# Patient Record
Sex: Male | Born: 1985 | Race: Black or African American | Hispanic: No | Marital: Married | Smoking: Current every day smoker
Health system: Southern US, Community
[De-identification: ages and names within clinical notes are randomized; demographics above are authoritative.]

---

## 2011-03-13 ENCOUNTER — Emergency Department (HOSPITAL_BASED_OUTPATIENT_CLINIC_OR_DEPARTMENT_OTHER)
Admission: EM | Admit: 2011-03-13 | Discharge: 2011-03-13 | Disposition: A | Discharge status: Home / Self Care | Payer: Self-pay | Attending: Emergency Medicine | Admitting: Emergency Medicine

## 2011-03-13 ENCOUNTER — Encounter: Payer: Self-pay | Admitting: Family Medicine

## 2011-03-13 DIAGNOSIS — R51 Headache: Secondary | ICD-10-CM | POA: Insufficient documentation

## 2011-03-13 DIAGNOSIS — F17200 Nicotine dependence, unspecified, uncomplicated: Secondary | ICD-10-CM | POA: Insufficient documentation

## 2011-03-13 MED ORDER — SODIUM CHLORIDE 0.9 % IV BOLUS (SEPSIS)
1000.0000 mL | Freq: Once | INTRAVENOUS | Status: AC
  Administered 2011-03-13: 1000 mL via INTRAVENOUS

## 2011-03-13 MED ORDER — KETOROLAC TROMETHAMINE 15 MG/ML IJ SOLN
15.0000 mg | Freq: Once | INTRAMUSCULAR | Status: AC
  Administered 2011-03-13: 15 mg via INTRAVENOUS
  Filled 2011-03-13: qty 1

## 2011-03-13 MED ORDER — METOCLOPRAMIDE HCL 5 MG/ML IJ SOLN
10.0000 mg | Freq: Once | INTRAMUSCULAR | Status: AC
  Administered 2011-03-13: 10 mg via INTRAVENOUS
  Filled 2011-03-13: qty 2

## 2011-03-13 MED ORDER — DIPHENHYDRAMINE HCL 50 MG/ML IJ SOLN
25.0000 mg | Freq: Once | INTRAMUSCULAR | Status: AC
  Administered 2011-03-13: 25 mg via INTRAVENOUS
  Filled 2011-03-13: qty 1
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
## 2011-03-13 NOTE — ED Notes (Signed)
Pt c/o headache upon awakening this morning. Pt sts he did not take any medication for same. Pt denies n/v/d, fever.

## 2012-02-26 ENCOUNTER — Emergency Department (HOSPITAL_BASED_OUTPATIENT_CLINIC_OR_DEPARTMENT_OTHER): Payer: Self-pay

## 2012-02-26 ENCOUNTER — Emergency Department (HOSPITAL_BASED_OUTPATIENT_CLINIC_OR_DEPARTMENT_OTHER)
Admission: EM | Admit: 2012-02-26 | Discharge: 2012-02-26 | Disposition: A | Discharge status: Home / Self Care | Payer: Self-pay | Attending: Emergency Medicine | Admitting: Emergency Medicine

## 2012-02-26 ENCOUNTER — Encounter (HOSPITAL_BASED_OUTPATIENT_CLINIC_OR_DEPARTMENT_OTHER): Payer: Self-pay

## 2012-02-26 DIAGNOSIS — K5900 Constipation, unspecified: Secondary | ICD-10-CM | POA: Insufficient documentation

## 2012-02-26 DIAGNOSIS — F17200 Nicotine dependence, unspecified, uncomplicated: Secondary | ICD-10-CM | POA: Insufficient documentation

## 2012-02-26 DIAGNOSIS — R110 Nausea: Secondary | ICD-10-CM | POA: Insufficient documentation

## 2012-02-26 LAB — URINALYSIS, ROUTINE W REFLEX MICROSCOPIC
Glucose, UA: NEGATIVE mg/dL
Leukocytes, UA: NEGATIVE
Protein, ur: NEGATIVE mg/dL
Specific Gravity, Urine: 1.03 (ref 1.005–1.030)
pH: 5.5 (ref 5.0–8.0)

## 2012-02-26 MED ORDER — FENTANYL CITRATE 0.05 MG/ML IJ SOLN
100.0000 ug | Freq: Once | INTRAMUSCULAR | Status: AC
  Administered 2012-02-26: 100 ug via INTRAVENOUS
  Filled 2012-02-26: qty 2

## 2012-02-26 MED ORDER — SODIUM CHLORIDE 0.9 % IV SOLN
INTRAVENOUS | Status: DC
  Administered 2012-02-26: 125 mL/h via INTRAVENOUS

## 2012-02-26 MED ORDER — ONDANSETRON HCL 4 MG/2ML IJ SOLN
4.0000 mg | Freq: Once | INTRAMUSCULAR | Status: AC
Start: 2012-02-26 — End: 2012-02-26
  Administered 2012-02-26: 4 mg via INTRAVENOUS
  Filled 2012-02-26: qty 2

## 2012-02-26 NOTE — ED Provider Notes (Signed)
History     CSN: [id number]  Arrival date & time 02/26/12  7829   First MD Initiated Contact with Patient 02/26/12 [phone]      Chief Complaint  Patient presents with  . Abdominal Pain    (Consider location/radiation/quality/duration/timing/severity/associated sxs/prior treatment) HPI Is a 26 year old black male who awoke with pain in the left flank and left lower quadrant about 3 AM. He states the pain has gotten worse and is now about an 8/10. He has been nauseated with it but has not vomited. He has not had diarrhea, fever or chills. He does have a headache. He denies any urinary symptoms.  History reviewed. No pertinent past medical history.  History reviewed. No pertinent past surgical history.  No family history on file.  History  Substance Use Topics  . Smoking status: Current Every Day Smoker  . Smokeless tobacco: Not on file  . Alcohol Use: Yes      Review of Systems  All other systems reviewed and are negative.    Allergies  Review of patient's allergies indicates no known allergies.  Home Medications  No current outpatient prescriptions on file.  BP 101/53  Pulse 65  Temp 97.6 F (36.4 C) (Oral)  Resp 16  SpO2 98%  Physical Exam General: Well-developed, well-nourished male in no acute distress; appearance consistent with age of record HENT: normocephalic, atraumatic Eyes: pupils equal round and reactive to light; extraocular muscles intact Neck: supple Heart: regular rate and rhythm Lungs: clear to auscultation bilaterally Abdomen: soft; nondistended; mild left lower quadrant tenderness; bowel sounds  GU: Mild left flank tenderness Extremities: No deformity; full range of motion Neurologic: Awake, alert and oriented; motor function intact in all extremities and symmetric; no facial droop Skin: Warm and dry Psychiatric: Flat affect    ED Course  Procedures (including critical care time)     MDM   Nursing notes and vitals signs,  including pulse oximetry, reviewed.  Summary of this visit's results, reviewed by myself:  Labs:  Results for orders placed during the hospital encounter of 02/26/12  URINALYSIS, ROUTINE W REFLEX MICROSCOPIC      Component Value Range   Color, Urine YELLOW  YELLOW   APPearance CLOUDY (*) CLEAR   Specific Gravity, Urine 1.030  1.005 - 1.030   pH 5.5  5.0 - 8.0   Glucose, UA NEGATIVE  NEGATIVE mg/dL   Hgb urine dipstick NEGATIVE  NEGATIVE   Bilirubin Urine NEGATIVE  NEGATIVE   Ketones, ur NEGATIVE  NEGATIVE mg/dL   Protein, ur NEGATIVE  NEGATIVE mg/dL   Urobilinogen, UA 1.0  0.0 - 1.0 mg/dL   Nitrite NEGATIVE  NEGATIVE   Leukocytes, UA NEGATIVE  NEGATIVE    Imaging Studies: Ct Abdomen Pelvis Wo Contrast  02/26/2012  *RADIOLOGY REPORT*  Clinical Data: Flank pain and nausea.  CT ABDOMEN AND PELVIS WITHOUT CONTRAST  Technique:  Multidetector CT imaging of the abdomen and pelvis was performed following the standard protocol without intravenous contrast.  Comparison: None.  Findings: The lung bases are clear.  No pleural or pericardial effusion.  There is no hydronephrosis on the right or left and no urinary tract stones are identified.  The gallbladder, liver, spleen, adrenal glands and pancreas appear normal.  The patient has a fairly large stool burden throughout the colon.  The colon is otherwise unremarkable.  The stomach and small bowel appear normal.  No lymphadenopathy or fluid is identified. The appendix is not discretely seen but no evidence of inflammatory process is  identified.  Imaged osseous structures demonstrate convex left thoracic scoliosis.  No worrisome bony lesion is identified.  IMPRESSION: Negative for urinary tract stone.  No acute finding.  Large stool burden again noted.   Original Report Authenticated By: Bernadene Bell. D'ALESSIO, M.D.    6:23 AM Patient advised of CT findings and recommended a laxative to relieve his symptoms. He was advised that narcotics are  contraindicated in the presence of constipation.        Hanley Seamen, MD 02/26/12 [phone]

## 2012-02-26 NOTE — ED Notes (Signed)
Patient reports that he was awakened with general abdominal pain that starts on both sided and radiates to umbilicus, nausea without vomiting, no diarrhea

## 2012-04-19 ENCOUNTER — Emergency Department (HOSPITAL_BASED_OUTPATIENT_CLINIC_OR_DEPARTMENT_OTHER): Payer: Self-pay

## 2012-04-19 ENCOUNTER — Encounter (HOSPITAL_BASED_OUTPATIENT_CLINIC_OR_DEPARTMENT_OTHER): Payer: Self-pay

## 2012-04-19 ENCOUNTER — Emergency Department (HOSPITAL_BASED_OUTPATIENT_CLINIC_OR_DEPARTMENT_OTHER)
Admission: EM | Admit: 2012-04-19 | Discharge: 2012-04-19 | Disposition: A | Discharge status: Home / Self Care | Payer: Self-pay | Attending: Emergency Medicine | Admitting: Emergency Medicine

## 2012-04-19 DIAGNOSIS — B349 Viral infection, unspecified: Secondary | ICD-10-CM

## 2012-04-19 DIAGNOSIS — IMO0001 Reserved for inherently not codable concepts without codable children: Secondary | ICD-10-CM | POA: Insufficient documentation

## 2012-04-19 DIAGNOSIS — F17200 Nicotine dependence, unspecified, uncomplicated: Secondary | ICD-10-CM | POA: Insufficient documentation

## 2012-04-19 DIAGNOSIS — R05 Cough: Secondary | ICD-10-CM | POA: Insufficient documentation

## 2012-04-19 DIAGNOSIS — B9789 Other viral agents as the cause of diseases classified elsewhere: Secondary | ICD-10-CM | POA: Insufficient documentation

## 2012-04-19 DIAGNOSIS — R51 Headache: Secondary | ICD-10-CM | POA: Insufficient documentation

## 2012-04-19 DIAGNOSIS — R059 Cough, unspecified: Secondary | ICD-10-CM | POA: Insufficient documentation

## 2012-04-19 MED ORDER — IBUPROFEN 800 MG PO TABS: 800.0000 mg | ORAL_TABLET | Freq: Three times a day (TID) | ORAL | Status: AC

## 2012-04-19 MED ORDER — IBUPROFEN 800 MG PO TABS
800.0000 mg | ORAL_TABLET | Freq: Once | ORAL | Status: AC
  Administered 2012-04-19: 800 mg via ORAL
  Filled 2012-04-19: qty 1

## 2012-04-19 NOTE — ED Provider Notes (Signed)
History     CSN: [id number]  Arrival date & time 04/19/12  1601   First MD Initiated Contact with Patient 04/19/12 1623      Chief Complaint  Patient presents with  . Fever  . Generalized Body Aches  . Headache  . Cough    (Consider location/radiation/quality/duration/timing/severity/associated sxs/prior treatment) HPI Comments: Pt states that it started yesterday and he has not taken anything for the symptoms  Patient is a 26 y.o. male presenting with fever. The history is provided by the patient. No language interpreter was used.  Fever Primary symptoms of the febrile illness include fever, headaches, cough and myalgias. Primary symptoms do not include nausea, vomiting or diarrhea. The current episode started yesterday. This is a new problem. The problem has not changed since onset.   History reviewed. No pertinent past medical history.  History reviewed. No pertinent past surgical history.  No family history on file.  History  Substance Use Topics  . Smoking status: Current Every Day Smoker -- 0.5 packs/day  . Smokeless tobacco: Not on file  . Alcohol Use: Yes     Comment: occasional      Review of Systems  Constitutional: Positive for fever.  Respiratory: Positive for cough.   Gastrointestinal: Negative for nausea, vomiting and diarrhea.  Musculoskeletal: Positive for myalgias.  Neurological: Positive for headaches.    Allergies  Review of patient's allergies indicates no known allergies.  Home Medications  No current outpatient prescriptions on file.  BP 123/65  Pulse 92  Temp 100.2 F (37.9 C) (Oral)  Resp 18  Ht 6\' 1"  (1.854 m)  Wt 167 lb (75.751 kg)  BMI 22.03 kg/m2  SpO2 100%  Physical Exam  Nursing note and vitals reviewed. Constitutional: He is oriented to person, place, and time. He appears well-developed and well-nourished.  HENT:  Right Ear: External ear normal.  Left Ear: External ear normal.  Mouth/Throat: Oropharynx is clear and  moist.  Eyes: Conjunctivae normal and EOM are normal.  Neck: Normal range of motion. Neck supple.       No meningeal signs  Cardiovascular: Normal rate and regular rhythm.   Pulmonary/Chest: Effort normal and breath sounds normal.  Abdominal: Soft. Bowel sounds are normal. There is tenderness.  Musculoskeletal: Normal range of motion.  Neurological: He is alert and oriented to person, place, and time.  Skin: Skin is warm.  Psychiatric: He has a normal mood and affect.    ED Course  Procedures (including critical care time)  Labs Reviewed - No data to display Dg Chest 2 View  04/19/2012  *RADIOLOGY REPORT*  Clinical Data: Fever, body aches, headache and cough.  CHEST - 2 VIEW  Comparison: None.  Findings: Lungs are clear.  Heart size is normal.  No pneumothorax or pleural fluid.  No focal bony abnormality.  IMPRESSION: Negative chest.   Original Report Authenticated By: Holley Dexter, M.D.      1. Viral illness       MDM  Healthy appearing:no concern for meningitis:pt can treat symptomatically at home        Teressa Lower, NP 04/19/12 1730

## 2012-04-19 NOTE — ED Notes (Signed)
Pt reports onset of cough, headache, fever and generalized body aches last night.

## 2012-04-20 NOTE — ED Provider Notes (Signed)
Medical screening examination/treatment/procedure(s) were performed by non-physician practitioner and as supervising physician I was immediately available for consultation/collaboration.  Jones Skene, M.D.     Jones Skene, MD 04/20/12 0041

## 2012-05-07 ENCOUNTER — Encounter (HOSPITAL_BASED_OUTPATIENT_CLINIC_OR_DEPARTMENT_OTHER): Payer: Self-pay | Admitting: *Deleted

## 2012-05-07 ENCOUNTER — Emergency Department (HOSPITAL_BASED_OUTPATIENT_CLINIC_OR_DEPARTMENT_OTHER)
Admission: EM | Admit: 2012-05-07 | Discharge: 2012-05-07 | Disposition: A | Discharge status: Home / Self Care | Payer: No Typology Code available for payment source | Attending: Emergency Medicine | Admitting: Emergency Medicine

## 2012-05-07 ENCOUNTER — Emergency Department (HOSPITAL_BASED_OUTPATIENT_CLINIC_OR_DEPARTMENT_OTHER): Payer: No Typology Code available for payment source

## 2012-05-07 DIAGNOSIS — Y939 Activity, unspecified: Secondary | ICD-10-CM | POA: Insufficient documentation

## 2012-05-07 DIAGNOSIS — IMO0002 Reserved for concepts with insufficient information to code with codable children: Secondary | ICD-10-CM | POA: Insufficient documentation

## 2012-05-07 DIAGNOSIS — F17200 Nicotine dependence, unspecified, uncomplicated: Secondary | ICD-10-CM | POA: Insufficient documentation

## 2012-05-07 DIAGNOSIS — Y999 Unspecified external cause status: Secondary | ICD-10-CM | POA: Insufficient documentation

## 2012-05-07 DIAGNOSIS — Y92410 Unspecified street and highway as the place of occurrence of the external cause: Secondary | ICD-10-CM | POA: Insufficient documentation

## 2012-05-07 DIAGNOSIS — M545 Low back pain: Secondary | ICD-10-CM

## 2012-05-07 MED ORDER — IBUPROFEN 600 MG PO TABS: 600.0000 mg | ORAL_TABLET | Freq: Four times a day (QID) | ORAL | Status: AC | PRN
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
## 2012-12-06 ENCOUNTER — Emergency Department (HOSPITAL_BASED_OUTPATIENT_CLINIC_OR_DEPARTMENT_OTHER)
Admission: EM | Admit: 2012-12-06 | Discharge: 2012-12-06 | Disposition: A | Discharge status: Home / Self Care | Payer: No Typology Code available for payment source | Attending: Emergency Medicine | Admitting: Emergency Medicine

## 2012-12-06 ENCOUNTER — Encounter (HOSPITAL_BASED_OUTPATIENT_CLINIC_OR_DEPARTMENT_OTHER): Payer: Self-pay | Admitting: Family Medicine

## 2012-12-06 ENCOUNTER — Emergency Department (HOSPITAL_BASED_OUTPATIENT_CLINIC_OR_DEPARTMENT_OTHER): Payer: No Typology Code available for payment source

## 2012-12-06 DIAGNOSIS — F17200 Nicotine dependence, unspecified, uncomplicated: Secondary | ICD-10-CM | POA: Insufficient documentation

## 2012-12-06 DIAGNOSIS — S0990XA Unspecified injury of head, initial encounter: Secondary | ICD-10-CM | POA: Insufficient documentation

## 2012-12-06 DIAGNOSIS — M25511 Pain in right shoulder: Secondary | ICD-10-CM

## 2012-12-06 DIAGNOSIS — Y939 Activity, unspecified: Secondary | ICD-10-CM | POA: Insufficient documentation

## 2012-12-06 DIAGNOSIS — Y92410 Unspecified street and highway as the place of occurrence of the external cause: Secondary | ICD-10-CM | POA: Insufficient documentation

## 2012-12-06 DIAGNOSIS — S4980XA Other specified injuries of shoulder and upper arm, unspecified arm, initial encounter: Secondary | ICD-10-CM | POA: Insufficient documentation

## 2012-12-06 DIAGNOSIS — S46909A Unspecified injury of unspecified muscle, fascia and tendon at shoulder and upper arm level, unspecified arm, initial encounter: Secondary | ICD-10-CM | POA: Insufficient documentation

## 2012-12-06 MED ORDER — ONDANSETRON HCL 4 MG PO TABS: 4.0000 mg | ORAL_TABLET | Freq: Four times a day (QID) | ORAL | Status: AC

## 2012-12-06 MED ORDER — IBUPROFEN 800 MG PO TABS
800.0000 mg | ORAL_TABLET | Freq: Once | ORAL | Status: AC
  Administered 2012-12-06: 800 mg via ORAL
  Filled 2012-12-06: qty 1

## 2012-12-06 MED ORDER — ACETAMINOPHEN 325 MG PO TABS: 325.0000 mg | ORAL_TABLET | Freq: Four times a day (QID) | ORAL | Status: AC | PRN
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
## 2012-12-07 NOTE — ED Provider Notes (Signed)
Medical screening examination/treatment/procedure(s) were performed by non-physician practitioner and as supervising physician I was immediately available for consultation/collaboration.   Apolinar Bero B. Marchel Foote, MD 12/07/12 2047 
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
## 2015-12-25 ENCOUNTER — Encounter (HOSPITAL_BASED_OUTPATIENT_CLINIC_OR_DEPARTMENT_OTHER): Payer: Self-pay | Admitting: Emergency Medicine

## 2015-12-25 ENCOUNTER — Emergency Department (HOSPITAL_BASED_OUTPATIENT_CLINIC_OR_DEPARTMENT_OTHER): Payer: No Typology Code available for payment source

## 2015-12-25 ENCOUNTER — Emergency Department (HOSPITAL_BASED_OUTPATIENT_CLINIC_OR_DEPARTMENT_OTHER)
Admission: EM | Admit: 2015-12-25 | Discharge: 2015-12-25 | Disposition: A | Discharge status: Home / Self Care | Payer: No Typology Code available for payment source | Attending: Emergency Medicine | Admitting: Emergency Medicine

## 2015-12-25 DIAGNOSIS — R0789 Other chest pain: Secondary | ICD-10-CM

## 2015-12-25 DIAGNOSIS — F17200 Nicotine dependence, unspecified, uncomplicated: Secondary | ICD-10-CM | POA: Insufficient documentation

## 2015-12-25 MED ORDER — IBUPROFEN 800 MG PO TABS
ORAL_TABLET | ORAL | Status: AC
  Filled 2015-12-25: qty 1

## 2015-12-25 MED ORDER — IBUPROFEN 800 MG PO TABS: 800.0000 mg | ORAL_TABLET | Freq: Three times a day (TID) | ORAL | 0 refills | Status: AC | PRN

## 2015-12-25 MED ORDER — IBUPROFEN 800 MG PO TABS
800.0000 mg | ORAL_TABLET | Freq: Once | ORAL | Status: AC
  Administered 2015-12-25: 800 mg via ORAL
  Filled 2015-12-25: qty 1

## 2015-12-25 MED ORDER — IBUPROFEN 400 MG PO TABS: 600.0000 mg | ORAL_TABLET | Freq: Once | ORAL | Status: DC

## 2015-12-25 NOTE — ED Provider Notes (Signed)
[facility] Provider Note   CSN: [id number] Arrival date & time: 12/25/15  2119  By signing my name below, I, Doreatha MartinEva Mathews, attest that this documentation has been prepared under the direction and in the presence of  Eli Lilly and CompanyChristopher Aashika Carta, PA-C. Electronically Signed: Doreatha MartinEva Mathews, ED Scribe. 12/25/15. 11:13 PM.    History   Chief Complaint Chief Complaint  Patient presents with  . Chest Pain    HPI Hector ServeJohn Ward is a 30 y.o. male who presents to the Emergency Department complaining of moderate, intermittent substernal CP onset today after sneezing. Pt also complains of rhinorrhea onset. He states that his pain is exacerbated by sneezing, breathing and coughing and lasts for 3 minutes at a time. No alleviating factors noted. He denies fever, chills, SOB, abdominal pain, palpitations. He is a current smoker, 2.5 cigarettes per day. He is a non-drinker.   The history is provided by the patient. No language interpreter was used.    History reviewed. No pertinent past medical history.  There are no active problems to display for this patient.   History reviewed. No pertinent surgical history.     Home Medications    Prior to Admission medications   Medication Sig Start Date End Date Taking? Authorizing Provider  acetaminophen (TYLENOL) 325 MG tablet Take 1 tablet (325 mg total) by mouth every 6 (six) hours as needed for pain. 12/06/12   Marissa Sciacca, PA-C  ibuprofen (ADVIL,MOTRIN) 600 MG tablet Take 1 tablet (600 mg total) by mouth every 6 (six) hours as needed for pain. 05/07/12   Jerelyn ScottMartha Linker, MD  ibuprofen (ADVIL,MOTRIN) 800 MG tablet Take 1 tablet (800 mg total) by mouth 3 (three) times daily. 04/19/12   Teressa LowerVrinda Pickering, NP  ondansetron (ZOFRAN) 4 MG tablet Take 1 tablet (4 mg total) by mouth every 6 (six) hours. 12/06/12   Marissa Sciacca, PA-C    Family History History reviewed. No pertinent family history.  Social History Social History  Substance Use Topics  .  Smoking status: Current Every Day Smoker    Packs/day: 0.50  . Smokeless tobacco: Never Used  . Alcohol use Yes     Comment: occasional     Allergies   Review of patient's allergies indicates no known allergies.   Review of Systems Review of Systems A complete 10 system review of systems was obtained and all systems are negative except as noted in the HPI and PMH.    Physical Exam Updated Vital Signs BP 111/63 (BP Location: Left Arm)   Pulse 66   Temp 98.9 F (37.2 C) (Oral)   Resp 18   Ht 6\' 1"  (1.854 m)   Wt 216 lb 8 oz (98.2 kg)   SpO2 100%   BMI 28.56 kg/m   Physical Exam  Constitutional: He appears well-developed and well-nourished.  HENT:  Head: Normocephalic.  Eyes: Conjunctivae are normal.  Cardiovascular: Normal rate, regular rhythm and normal heart sounds.   No murmur heard. Pulmonary/Chest: Effort normal and breath sounds normal. No respiratory distress. He has no wheezes. He exhibits no tenderness.  Abdominal: He exhibits no distension.  Musculoskeletal: Normal range of motion.  Neurological: He is alert.  Skin: Skin is warm and dry.  Psychiatric: He has a normal mood and affect. His behavior is normal.  Nursing note and vitals reviewed.    ED Treatments / Results  Labs (all labs ordered are listed, but only abnormal results are displayed) Labs Reviewed - No data to display  EKG  EKG Interpretation  Date/Time:  Saturday December 25 2015 21:27:56 EDT Ventricular Rate:  63 PR Interval:  136 QRS Duration: 90 QT Interval:  400 QTC Calculation: 409 R Axis:   28 Text Interpretation:  Normal sinus rhythm Normal ECG No previous ECGs available Confirmed by LITTLE MD, RACHEL [id number]) on 12/25/2015 9:34:16 PM       Radiology Dg Chest 2 View  Result Date: 12/25/2015 CLINICAL DATA:  Chest pain and pressure after sneezing. EXAM: CHEST  2 VIEW COMPARISON:  04/19/2012 FINDINGS: The heart size and mediastinal contours are within normal limits. Both lungs  are clear. The visualized skeletal structures are unremarkable. IMPRESSION: No active cardiopulmonary disease. Electronically Signed   By: Ted Mcalpine M.D.   On: 12/25/2015 21:48    Procedures Procedures (including critical care time)  DIAGNOSTIC STUDIES: Oxygen Saturation is 100% on RA, normal by my interpretation.    COORDINATION OF CARE: 11:09 PM Discussed treatment plan with pt at bedside which includes CXR and pt agreed to plan.    Medications Ordered in ED Medications - No data to display   Initial Impression / Assessment and Plan / ED Course  I have reviewed the triage vital signs and the nursing notes.  Pertinent labs & imaging results that were available during my care of the patient were reviewed by me and considered in my medical decision making (see chart for details).  Clinical Course    Patient most likely has chest wall pain based on his history of present illness and physical exam findings.  Patient is advised plan and all questions were answered.  He said this and asked to follow-up with his primary care doctor  Final Clinical Impressions(s) / ED Diagnoses   Final diagnoses:  None    New Prescriptions New Prescriptions   No medications on file    I personally performed the services described in this documentation, which was scribed in my presence. The recorded information has been reviewed and is accurate.]    Charlestine Night, PA-C 12/25/15 2341    Laurence Spates, MD 12/26/15 [phone]

## 2015-12-25 NOTE — ED Triage Notes (Signed)
Patient states that he started to have central pain to his chest today worse with a sneeze. Reports that it feels like pressure on his chest

## 2015-12-25 NOTE — Discharge Instructions (Signed)
Return here as needed.  Follow up with a primary care doctor °

## 2017-07-15 IMAGING — CR DG CHEST 2V
2 series · 2 of 2 positions shown · non-contrast
Comparison: 04/19/2012

CLINICAL DATA: Chest pain and pressure after sneezing.

EXAM:
CHEST  2 VIEW

[w chest pa]
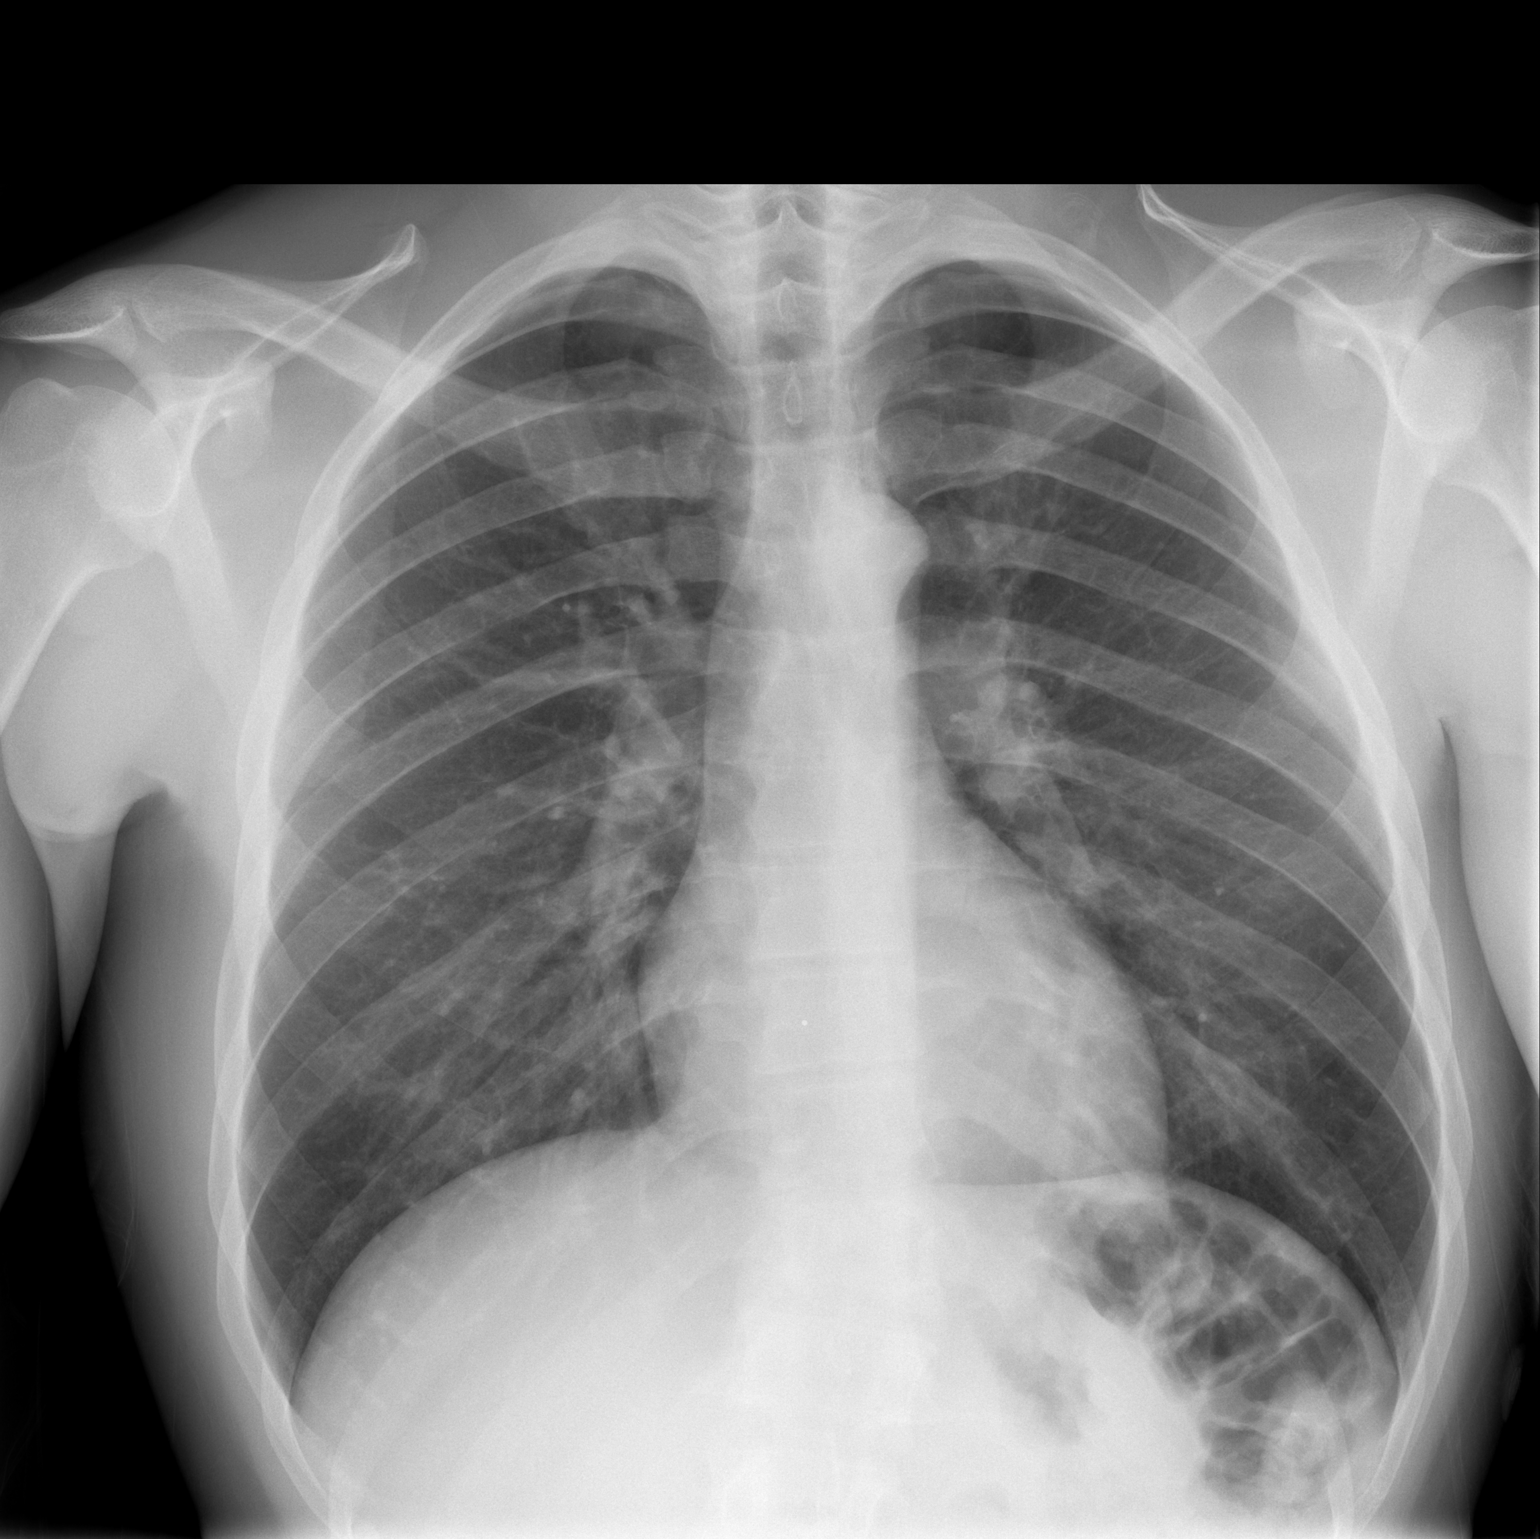

[w chest lat]
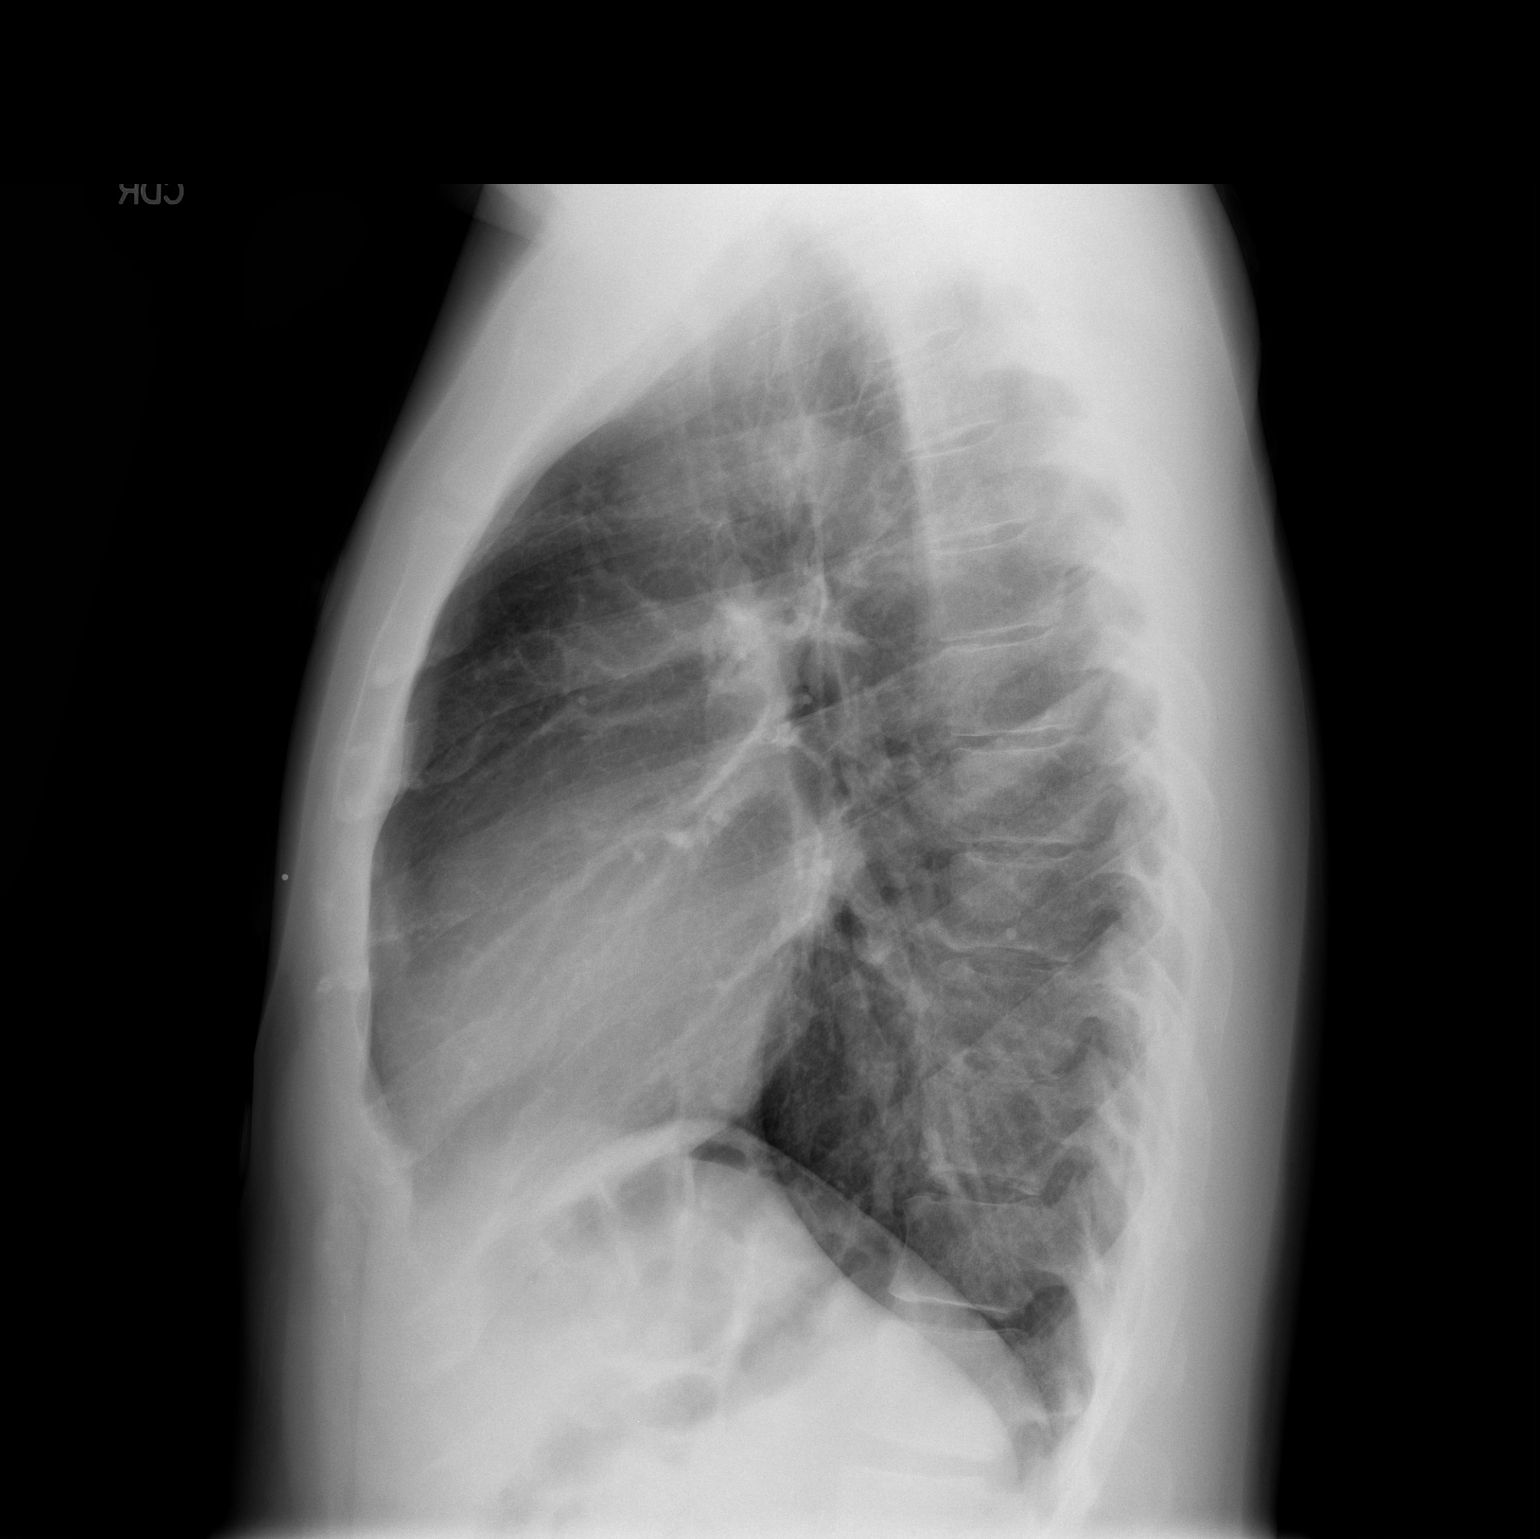

[2 of 2 positions shown; findings below may reference images not displayed]

FINDINGS: The heart size and mediastinal contours are within normal limits.
Both lungs are clear. The visualized skeletal structures are
unremarkable.
IMPRESSION: No active cardiopulmonary disease.

## 2019-10-10 ENCOUNTER — Other Ambulatory Visit: Payer: Self-pay

## 2019-10-10 ENCOUNTER — Emergency Department (HOSPITAL_BASED_OUTPATIENT_CLINIC_OR_DEPARTMENT_OTHER): Payer: 59

## 2019-10-10 ENCOUNTER — Emergency Department (HOSPITAL_BASED_OUTPATIENT_CLINIC_OR_DEPARTMENT_OTHER)
Admission: EM | Admit: 2019-10-10 | Discharge: 2019-10-10 | Disposition: A | Discharge status: Home / Self Care | Payer: 59 | Attending: Emergency Medicine | Admitting: Emergency Medicine

## 2019-10-10 ENCOUNTER — Encounter (HOSPITAL_BASED_OUTPATIENT_CLINIC_OR_DEPARTMENT_OTHER): Payer: Self-pay | Admitting: Emergency Medicine

## 2019-10-10 DIAGNOSIS — M791 Myalgia, unspecified site: Secondary | ICD-10-CM | POA: Insufficient documentation

## 2019-10-10 DIAGNOSIS — Z20822 Contact with and (suspected) exposure to covid-19: Secondary | ICD-10-CM | POA: Insufficient documentation

## 2019-10-10 DIAGNOSIS — J069 Acute upper respiratory infection, unspecified: Secondary | ICD-10-CM | POA: Diagnosis not present

## 2019-10-10 DIAGNOSIS — F17210 Nicotine dependence, cigarettes, uncomplicated: Secondary | ICD-10-CM | POA: Diagnosis not present

## 2019-10-10 DIAGNOSIS — R05 Cough: Secondary | ICD-10-CM | POA: Diagnosis not present

## 2019-10-10 DIAGNOSIS — J029 Acute pharyngitis, unspecified: Secondary | ICD-10-CM | POA: Diagnosis present

## 2019-10-10 LAB — SARS CORONAVIRUS 2 BY RT PCR (HOSPITAL ORDER, PERFORMED IN ~~LOC~~ HOSPITAL LAB): SARS Coronavirus 2: NEGATIVE

## 2019-10-10 MED ORDER — ALBUTEROL SULFATE HFA 108 (90 BASE) MCG/ACT IN AERS
8.0000 | INHALATION_SPRAY | Freq: Once | RESPIRATORY_TRACT | Status: AC
  Administered 2019-10-10: 8 via RESPIRATORY_TRACT
  Filled 2019-10-10: qty 6.7

## 2019-10-10 MED ORDER — IBUPROFEN 800 MG PO TABS
800.0000 mg | ORAL_TABLET | Freq: Once | ORAL | Status: AC
  Administered 2019-10-10: 800 mg via ORAL
  Filled 2019-10-10: qty 1

## 2019-10-10 NOTE — ED Triage Notes (Signed)
°  Patient comes in with nasal congestion and sore throat that started yesterday while at work.  Patient states he has had no sick contacts and has been vaccinated.  Felt febrile at home but not sure how high.  Taking OTC mucinex and nyquil.  Productive cough and hurts when he swallows.  Pain 6/10.

## 2019-10-10 NOTE — Discharge Instructions (Signed)
You may alternate Tylenol 1000 mg every 6 hours as needed for pain, fever and Ibuprofen 800 mg every 8 hours as needed for pain, fever.  Please take Ibuprofen with food.  Do not take more than 4000 mg of Tylenol (acetaminophen) in a 24 hour period.   Please quarantine until your COVID-19 test results are back.  You may follow-up on these with my chart.   You may continue to use your albuterol inhaler 2 to 4 puffs every 2-4 hours as needed for shortness of breath, wheezing.   Steps to find a Primary Care Provider (PCP):  Call [phone] or [phone] to access "[place] Find a Doctor Service."  2.  You may also go on the [facility] website at [email or web address]  3.  [facility] also frequently accepts new patients.  [facility]  [address] [place] [address] [phone]  4.  There are also multiple [facility], [facility] and [facility] practices throughout the [place] that are frequently accepting new patients. You may find a clinic that is close to your home and contact them.  [facility] [email or web address] [phone]  [facility] [email or web address]  [email or web address] [phone]  [address] [facility] [email or web address] [phone]  5.  Local Health Departments also can provide primary care services.  [facility]  [address] [place] [address] [phone]  [facility] Department [address] [place] [address] [phone]  [facility] [address] [place] [address]  [place] [address] [phone]

## 2019-10-10 NOTE — ED Provider Notes (Signed)
Care transferred to me.  Chest x-ray personally reviewed and is negative.  Agree with radiology interpretation.  Covid test is pending but he does not want a wait on the results.  He is otherwise well-appearing.  Return precautions and will discharge home.   Pricilla Loveless, MD 10/10/19 [phone]

## 2019-10-10 NOTE — ED Provider Notes (Signed)
TIME SEEN: 6:47 AM  CHIEF COMPLAINT: Sore throat, cough, feeling poorly  HPI: Patient is a 34 year old male with no significant past medical history who presents to the emergency department feeling poorly for the past day.  Reports sore throat, nonproductive cough, body aches.  No fevers, chills, nausea, vomiting or diarrhea.  Has had both COVID-19 vaccinations, last in February.  States he does work from [employer] but no known sick contacts.  No ear pain.  No shortness of breath.  ROS: See HPI Constitutional: no fever  Eyes: no drainage  ENT: no runny nose   Cardiovascular:  no chest pain  Resp: no SOB  GI: no vomiting GU: no dysuria Integumentary: no rash  Allergy: no hives  Musculoskeletal: no leg swelling  Neurological: no slurred speech ROS otherwise negative  PAST MEDICAL HISTORY/PAST SURGICAL HISTORY:  History reviewed. No pertinent past medical history.  MEDICATIONS:  Prior to Admission medications   Medication Sig Start Date End Date Taking? Authorizing Provider  acetaminophen (TYLENOL) 325 MG tablet Take 1 tablet (325 mg total) by mouth every 6 (six) hours as needed for pain. 12/06/12   Sciacca, Marissa, PA-C  ibuprofen (ADVIL,MOTRIN) 600 MG tablet Take 1 tablet (600 mg total) by mouth every 6 (six) hours as needed for pain. 05/07/12   Mabe, Forbes Cellar, MD  ibuprofen (ADVIL,MOTRIN) 800 MG tablet Take 1 tablet (800 mg total) by mouth 3 (three) times daily. 04/19/12   Glendell Docker, NP  ibuprofen (ADVIL,MOTRIN) 800 MG tablet Take 1 tablet (800 mg total) by mouth every 8 (eight) hours as needed. 12/25/15   Lawyer, Harrell Gave, PA-C  ondansetron (ZOFRAN) 4 MG tablet Take 1 tablet (4 mg total) by mouth every 6 (six) hours. 12/06/12   Sciacca, Marissa, PA-C    ALLERGIES:  No Known Allergies  SOCIAL HISTORY:  Social History   Tobacco Use  . Smoking status: Current Every Day Smoker    Packs/day: 0.50  . Smokeless tobacco: Never Used  Substance Use Topics  .  Alcohol use: Yes    Comment: occasional    FAMILY HISTORY: History reviewed. No pertinent family history.  EXAM: BP 113/71 (BP Location: Right Arm)   Pulse 95   Temp 99.3 F (37.4 C) (Oral)   Resp 20   Ht 6\' 1"  (1.854 m)   Wt 129 kg   SpO2 97%   BMI 37.52 kg/m  CONSTITUTIONAL: Alert and oriented and responds appropriately to questions. Well-appearing; well-nourished HEAD: Normocephalic EYES: Conjunctivae clear, pupils appear equal, EOM appear intact ENT: normal nose; moist mucous membranes; No pharyngeal erythema or petechiae, no tonsillar hypertrophy or exudate, no uvular deviation, no unilateral swelling, no trismus or drooling, no muffled voice, normal phonation, no stridor, no dental caries present, no drainable dental abscess noted, no Ludwig's angina, tongue sits flat in the bottom of the mouth, no angioedema, no facial erythema or warmth, no facial swelling; no pain with movement of the neck, no cervical LAD.  TMs are clear bilaterally without erythema, purulence, bulging, perforation, effusion.  No cerumen impaction or sign of foreign body in the external auditory canal. No inflammation, erythema or drainage from the external auditory canal. No signs of mastoiditis. No pain with manipulation of the pinna bilaterally. NECK: Supple, normal ROM, no lymphadenopathy CARD: RRR; S1 and S2 appreciated; no murmurs, no clicks, no rubs, no gallops RESP: Normal chest excursion without splinting or tachypnea; breath sounds equal bilaterally but he does have some expiratory wheezes on exam, no rhonchi or rales, no  hypoxia or respiratory distress, speaking full sentences ABD/GI: Normal bowel sounds; non-distended; soft, non-tender, no rebound, no guarding, no peritoneal signs, no hepatosplenomegaly BACK:  The back appears normal EXT: Normal ROM in all joints; no deformity noted, no edema; no cyanosis SKIN: Normal color for age and race; warm; no rash on exposed skin NEURO: Moves all extremities  equally PSYCH: The patient's mood and manner are appropriate.   MEDICAL DECISION MAKING: Patient here with likely viral upper respiratory infection.  We have offered Covid testing although he has had both COVID-19 vaccinations.  He agrees to testing today but states he can follow-up on these results as an outpatient.  Will give ibuprofen for symptomatic relief.  No sign of otitis media, pharyngitis, PTA, deep space neck infection, meningitis.  Will obtain chest x-ray given he is wheezing.  He has no known history of asthma or COPD.  Will provide with albuterol inhaler.  ED PROGRESS: Signed out to Dr. Criss Alvine to follow-up on patient's chest x-ray and reassess after inhaler.  Anticipate discharge home.  Patient will be provided with a work note.  I reviewed all nursing notes and pertinent previous records as available.  I have reviewed and interpreted any EKGs, lab and urine results, imaging (as available).     Hector Ward was evaluated in Emergency Department on 10/10/2019 for the symptoms described in the history of present illness. He was evaluated in the context of the global COVID-19 pandemic, which necessitated consideration that the patient might be at risk for infection with the SARS-CoV-2 virus that causes COVID-19. Institutional protocols and algorithms that pertain to the evaluation of patients at risk for COVID-19 are in a state of rapid change based on information released by regulatory bodies including the CDC and federal and state organizations. These policies and algorithms were followed during the patient's care in the ED.      Haunani Dickard, Layla Maw, DO 10/10/19 [phone]

## 2020-05-04 ENCOUNTER — Encounter (HOSPITAL_BASED_OUTPATIENT_CLINIC_OR_DEPARTMENT_OTHER): Payer: Self-pay | Admitting: Emergency Medicine

## 2020-05-04 ENCOUNTER — Emergency Department (HOSPITAL_BASED_OUTPATIENT_CLINIC_OR_DEPARTMENT_OTHER)
Admission: EM | Admit: 2020-05-04 | Discharge: 2020-05-04 | Disposition: A | Discharge status: Home / Self Care | Payer: 59 | Attending: Emergency Medicine | Admitting: Emergency Medicine

## 2020-05-04 ENCOUNTER — Other Ambulatory Visit: Payer: Self-pay

## 2020-05-04 DIAGNOSIS — F17200 Nicotine dependence, unspecified, uncomplicated: Secondary | ICD-10-CM | POA: Insufficient documentation

## 2020-05-04 DIAGNOSIS — U071 COVID-19: Secondary | ICD-10-CM | POA: Insufficient documentation

## 2020-05-04 DIAGNOSIS — R059 Cough, unspecified: Secondary | ICD-10-CM | POA: Diagnosis present

## 2020-05-04 DIAGNOSIS — R6889 Other general symptoms and signs: Secondary | ICD-10-CM

## 2020-05-04 DIAGNOSIS — R519 Headache, unspecified: Secondary | ICD-10-CM

## 2020-05-04 LAB — SARS CORONAVIRUS 2 (TAT 6-24 HRS): SARS Coronavirus 2: POSITIVE — AB

## 2020-05-04 MED ORDER — IBUPROFEN 800 MG PO TABS
800.0000 mg | ORAL_TABLET | Freq: Once | ORAL | Status: AC
  Administered 2020-05-04: 800 mg via ORAL

## 2020-05-04 MED ORDER — IBUPROFEN 800 MG PO TABS
ORAL_TABLET | ORAL | Status: AC
  Filled 2020-05-04: qty 1

## 2020-05-04 NOTE — ED Notes (Signed)
Pt discharged to home. Discharge instructions have been discussed with patient and/or family members. Pt verbally acknowledges understanding d/c instructions, and endorses comprehension to checkout at registration before leaving.  °

## 2020-05-04 NOTE — Discharge Instructions (Addendum)
The Covid test is pending at time of discharge.  Instructions on how to follow this up on my chart are on your discharge paperwork, you can also call the department if you are having trouble finding these results.  If he/she is Covid positive he/she will need to be quarantine for total 10 days since the onset of symptoms +24 hours of no symptoms. if he/she is not Covid positive he/she is able to go back to normal day-to-day routine as long as he/she is not having fevers and it has been 24 hours since his/her last fever.  For fevers chills body aches you can take 600 mg of ibuprofen every 6 hours, you can take 1000 mg of Tylenol every 6 hours, you can alternate these every 3 or you can take them together.  For other cold symptoms he can use over-the-counter cold medication just make sure there is no additional Tylenol in the medication. A teaspoon of honey works well as a cough suppressant.

## 2020-05-04 NOTE — ED Provider Notes (Signed)
[facility] EMERGENCY DEPARTMENT Provider Note   CSN: [id number] Arrival date & time: 05/04/20  9381     History Chief Complaint  Patient presents with  . Covid symptoms    Hector Ward is a 35 y.o. male.   Cough Cough characteristics:  Non-productive Severity:  Moderate Timing:  Intermittent Progression:  Waxing and waning Chronicity:  New Context: upper respiratory infection   Context: not sick contacts   Relieved by:  Nothing Worsened by:  Nothing Ineffective treatments:  None tried Associated symptoms: headaches, rhinorrhea, sinus congestion and sore throat   Associated symptoms: no chest pain, no chills, no fever, no rash and no shortness of breath        History reviewed. No pertinent past medical history.  There are no problems to display for this patient.   History reviewed. No pertinent surgical history.     No family history on file.  Social History   Tobacco Use  . Smoking status: Current Every Day Smoker    Packs/day: 0.50  . Smokeless tobacco: Never Used  Substance Use Topics  . Alcohol use: Yes    Comment: occasional  . Drug use: No    Home Medications Prior to Admission medications   Medication Sig Start Date End Date Taking? Authorizing Provider  acetaminophen (TYLENOL) 325 MG tablet Take 1 tablet (325 mg total) by mouth every 6 (six) hours as needed for pain. 12/06/12   Sciacca, Marissa, PA-C  ibuprofen (ADVIL,MOTRIN) 600 MG tablet Take 1 tablet (600 mg total) by mouth every 6 (six) hours as needed for pain. 05/07/12   Mabe, Latanya Maudlin, MD  ibuprofen (ADVIL,MOTRIN) 800 MG tablet Take 1 tablet (800 mg total) by mouth 3 (three) times daily. 04/19/12   Teressa Lower, NP  ibuprofen (ADVIL,MOTRIN) 800 MG tablet Take 1 tablet (800 mg total) by mouth every 8 (eight) hours as needed. 12/25/15   Lawyer, Cristal Deer, PA-C  ondansetron (ZOFRAN) 4 MG tablet Take 1 tablet (4 mg total) by mouth every 6 (six) hours. 12/06/12   Sciacca, Ashok Cordia,  PA-C    Allergies    Patient has no known allergies.  Review of Systems   Review of Systems  Constitutional: Negative for chills and fever.  HENT: Positive for congestion, rhinorrhea and sore throat.   Respiratory: Positive for cough. Negative for shortness of breath.   Cardiovascular: Negative for chest pain and palpitations.  Gastrointestinal: Negative for diarrhea, nausea and vomiting.  Genitourinary: Negative for difficulty urinating and dysuria.  Musculoskeletal: Negative for arthralgias and back pain.  Skin: Negative for color change and rash.  Neurological: Positive for headaches. Negative for light-headedness.    Physical Exam Updated Vital Signs BP 140/79 (BP Location: Left Arm)   Pulse 88   Temp 98.2 F (36.8 C) (Oral)   Resp 19   Ht 6\' 1"  (1.854 m)   Wt 131.1 kg   SpO2 99%   BMI 38.13 kg/m   Physical Exam Vitals and nursing note reviewed.  Constitutional:      General: He is not in acute distress.    Appearance: Normal appearance.  HENT:     Head: Normocephalic and atraumatic.     Nose: No rhinorrhea.     Mouth/Throat:     Mouth: Mucous membranes are moist.     Pharynx: Posterior oropharyngeal erythema (mild) present. No oropharyngeal exudate.  Eyes:     General:        Right eye: No discharge.  Left eye: No discharge.     Conjunctiva/sclera: Conjunctivae normal.  Cardiovascular:     Rate and Rhythm: Normal rate and regular rhythm.  Pulmonary:     Effort: Pulmonary effort is normal. No respiratory distress.     Breath sounds: No stridor. No wheezing or rhonchi.  Abdominal:     General: Abdomen is flat. There is no distension.     Palpations: Abdomen is soft.  Musculoskeletal:        General: No deformity or signs of injury.  Skin:    General: Skin is warm and dry.     Capillary Refill: Capillary refill takes less than 2 seconds.  Neurological:     General: No focal deficit present.     Mental Status: He is alert. Mental status is at  baseline.     Motor: No weakness.     Comments: 5 out of 5 motor strength in all extremities, sensation intact throughout, no dysmetria, no dysdiadochokinesia, no ataxia with ambulation, cranial nerves II through XII intact, alert and oriented to person place and time   Psychiatric:        Mood and Affect: Mood normal.        Behavior: Behavior normal.        Thought Content: Thought content normal.     ED Results / Procedures / Treatments   Labs (all labs ordered are listed, but only abnormal results are displayed) Labs Reviewed  RESP PANEL BY RT-PCR (FLU A&B, COVID) ARPGX2    EKG None  Radiology No results found.  Procedures Procedures (including critical care time)  Medications Ordered in ED Medications  ibuprofen (ADVIL) tablet 800 mg (800 mg Oral Given 05/04/20 0349)    ED Course  I have reviewed the triage vital signs and the nursing notes.  Pertinent labs & imaging results that were available during my care of the patient were reviewed by me and considered in my medical decision making (see chart for details).    MDM Rules/Calculators/A&P                          Flulike illness, Covid swab sent, vaccinated for Covid but not flu. Vital signs stable no increased work of breathing well-hydrated tolerating p.o. Patient was concerned he may have been exposed wanted testing. Overall well-appearing safe for outpatient management with return precautions. Precautions asked. Final Clinical Impression(s) / ED Diagnoses Final diagnoses:  Acute nonintractable headache, unspecified headache type  Flu-like symptoms    Rx / DC Orders ED Discharge Orders    None       Sabino Donovan, MD 05/04/20 [phone]

## 2020-05-04 NOTE — ED Triage Notes (Signed)
Pt c/o headach, body aches, and cough.
